# Patient Record
Sex: Male | Born: 1990 | Race: White | Hispanic: No | Marital: Single | Smoking: Never smoker
Health system: Southern US, Community
[De-identification: ages and names within clinical notes are randomized; demographics above are authoritative.]

## PROBLEM LIST (undated history)

## (undated) ENCOUNTER — Emergency Department (HOSPITAL_BASED_OUTPATIENT_CLINIC_OR_DEPARTMENT_OTHER): Admission: EM | Discharge status: Absent without leave | Payer: 59

---

## 2016-01-31 HISTORY — PX: ESOPHAGOGASTRODUODENOSCOPY ENDOSCOPY: SHX5814

## 2020-04-12 ENCOUNTER — Other Ambulatory Visit: Payer: Self-pay

## 2020-04-12 ENCOUNTER — Emergency Department (HOSPITAL_BASED_OUTPATIENT_CLINIC_OR_DEPARTMENT_OTHER): Payer: 59

## 2020-04-12 ENCOUNTER — Emergency Department (HOSPITAL_BASED_OUTPATIENT_CLINIC_OR_DEPARTMENT_OTHER)
Admission: EM | Admit: 2020-04-12 | Discharge: 2020-04-13 | Disposition: A | Discharge status: Home / Self Care | Payer: 59 | Attending: Emergency Medicine | Admitting: Emergency Medicine

## 2020-04-12 ENCOUNTER — Encounter (HOSPITAL_BASED_OUTPATIENT_CLINIC_OR_DEPARTMENT_OTHER): Payer: Self-pay | Admitting: Emergency Medicine

## 2020-04-12 DIAGNOSIS — F17200 Nicotine dependence, unspecified, uncomplicated: Secondary | ICD-10-CM | POA: Diagnosis not present

## 2020-04-12 DIAGNOSIS — R112 Nausea with vomiting, unspecified: Secondary | ICD-10-CM | POA: Diagnosis not present

## 2020-04-12 DIAGNOSIS — R1032 Left lower quadrant pain: Secondary | ICD-10-CM | POA: Insufficient documentation

## 2020-04-12 DIAGNOSIS — R197 Diarrhea, unspecified: Secondary | ICD-10-CM | POA: Diagnosis not present

## 2020-04-12 DIAGNOSIS — N135 Crossing vessel and stricture of ureter without hydronephrosis: Secondary | ICD-10-CM

## 2020-04-12 LAB — LIPASE, BLOOD: Lipase: 26 U/L (ref 11–51)

## 2020-04-12 LAB — URINALYSIS, ROUTINE W REFLEX MICROSCOPIC
Bilirubin Urine: NEGATIVE
Glucose, UA: NEGATIVE mg/dL
Hgb urine dipstick: NEGATIVE
Ketones, ur: NEGATIVE mg/dL
Leukocytes,Ua: NEGATIVE
Nitrite: NEGATIVE
Protein, ur: NEGATIVE mg/dL
Specific Gravity, Urine: 1.02 (ref 1.005–1.030)
pH: 6 (ref 5.0–8.0)

## 2020-04-12 LAB — CBC
HCT: 43.8 % (ref 39.0–52.0)
Hemoglobin: 14.9 g/dL (ref 13.0–17.0)
MCH: 30.4 pg (ref 26.0–34.0)
MCHC: 34 g/dL (ref 30.0–36.0)
MCV: 89.4 fL (ref 80.0–100.0)
Platelets: 305 10*3/uL (ref 150–400)
RBC: 4.9 MIL/uL (ref 4.22–5.81)
RDW: 12.1 % (ref 11.5–15.5)
WBC: 11 10*3/uL — ABNORMAL HIGH (ref 4.0–10.5)
nRBC: 0 % (ref 0.0–0.2)

## 2020-04-12 LAB — RAPID URINE DRUG SCREEN, HOSP PERFORMED
Amphetamines: NOT DETECTED
Barbiturates: NOT DETECTED
Benzodiazepines: NOT DETECTED
Cocaine: NOT DETECTED
Opiates: NOT DETECTED
Tetrahydrocannabinol: NOT DETECTED

## 2020-04-12 LAB — COMPREHENSIVE METABOLIC PANEL
ALT: 44 U/L (ref 0–44)
AST: 29 U/L (ref 15–41)
Albumin: 4.6 g/dL (ref 3.5–5.0)
Alkaline Phosphatase: 61 U/L (ref 38–126)
Anion gap: 11 (ref 5–15)
BUN: 12 mg/dL (ref 6–20)
CO2: 25 mmol/L (ref 22–32)
Calcium: 9.7 mg/dL (ref 8.9–10.3)
Chloride: 102 mmol/L (ref 98–111)
Creatinine, Ser: 1.05 mg/dL (ref 0.61–1.24)
GFR, Estimated: >60 mL/min (ref >60)
Glucose, Bld: 116 mg/dL — ABNORMAL HIGH (ref 70–99)
Potassium: 3.4 mmol/L — ABNORMAL LOW (ref 3.5–5.1)
Sodium: 138 mmol/L (ref 135–145)
Total Bilirubin: 0.4 mg/dL (ref 0.3–1.2)
Total Protein: 7.7 g/dL (ref 6.5–8.1)

## 2020-04-12 MED ORDER — ONDANSETRON HCL 4 MG/2ML IJ SOLN
4.0000 mg | Freq: Once | INTRAMUSCULAR | Status: AC
  Administered 2020-04-12: 4 mg via INTRAVENOUS
  Filled 2020-04-12: qty 2

## 2020-04-12 MED ORDER — FENTANYL CITRATE (PF) 100 MCG/2ML IJ SOLN
100.0000 ug | Freq: Once | INTRAMUSCULAR | Status: AC
Start: 2020-04-12 — End: 2020-04-12
  Administered 2020-04-12: 100 ug via INTRAVENOUS
  Filled 2020-04-12: qty 2

## 2020-04-12 MED ORDER — IOHEXOL 300 MG/ML  SOLN
100.0000 mL | Freq: Once | INTRAMUSCULAR | Status: AC | PRN
  Administered 2020-04-12: 100 mL via INTRAVENOUS

## 2020-04-12 MED ORDER — MORPHINE SULFATE 15 MG PO TABS: 7.5000 mg | ORAL_TABLET | ORAL | 0 refills | Status: DC | PRN

## 2020-04-12 MED ORDER — METOCLOPRAMIDE HCL 5 MG/ML IJ SOLN
10.0000 mg | INTRAMUSCULAR | Status: AC
  Administered 2020-04-12: 10 mg via INTRAVENOUS
  Filled 2020-04-12: qty 2

## 2020-04-12 MED ORDER — SODIUM CHLORIDE 0.9 % IV BOLUS
1000.0000 mL | Freq: Once | INTRAVENOUS | Status: AC
  Administered 2020-04-12: 1000 mL via INTRAVENOUS

## 2020-04-12 MED ORDER — FENTANYL CITRATE (PF) 100 MCG/2ML IJ SOLN
50.0000 ug | Freq: Once | INTRAMUSCULAR | Status: AC
  Administered 2020-04-12: 50 ug via INTRAVENOUS
  Filled 2020-04-12: qty 2

## 2020-04-12 MED ORDER — ONDANSETRON 4 MG PO TBDP: ORAL_TABLET | ORAL | 0 refills | Status: DC

## 2020-04-12 MED ORDER — KETOROLAC TROMETHAMINE 30 MG/ML IJ SOLN
30.0000 mg | Freq: Once | INTRAMUSCULAR | Status: AC
  Administered 2020-04-12: 30 mg via INTRAVENOUS
  Filled 2020-04-12: qty 1

## 2020-04-12 MED ORDER — IOHEXOL 300 MG/ML  SOLN: 100.0000 mL | Freq: Once | INTRAMUSCULAR | Status: DC | PRN

## 2020-04-12 NOTE — Discharge Instructions (Signed)

## 2020-04-12 NOTE — ED Provider Notes (Signed)
I received the patient in signout from Dr. Clarene Duke, briefly the patient is a 30 year old male with recurrent left-sided flank pain.  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  Discussed with urology who recommended outpatient follow-up as long as we could control his pain.  On reassessment the patient is feeling much better and requesting discharge home.  Urology follow-up.   Melene Plan, DO 04/12/20 2341

## 2020-04-12 NOTE — ED Provider Notes (Signed)
I received patient in transfer from med [facility] from Dr. Bernette Mayers, who had evaluated patient for abdominal pain.  CT had been ordered but unfortunately CT scanner broke down after he had completed oral contrast but had not yet been scanned.  He was sent here for completion of imaging.  Imaging shows L UPJ obstruction w/ mod to severe hydronephrosis and evidence of possible stricture. Discussed w/ urology, Dr. Annabell Howells, who recommended symptom management and if able to control pain, f/u in clinic. I have discussed this with the patient. PT signed out to overnight provider pending reassessment after pain and nausea meds.   IMPRESSION:  Left ureteropelvic junction obstruction. Moderate to severe left  hydronephrosis associated with trace delayed nephrogram and marked  dilatation of the renal pelvis. There is brisk caliber change at the  ureteropelvic junction suggestive of a possible stricture.  Intraluminal mass not excluded on this single-phase venous contrast  study. No calcified renal or ureteral stone identified.    Burma Ketcher, Ambrose Finland, MD 04/12/20 2308

## 2020-04-12 NOTE — ED Triage Notes (Signed)
LLQ abdomina pain since 5 pm.  Pt has had similar in the past without known cause.  Sees GI doctor for same.  Some N/V/D.  Pt has paperwork from MD office requesting CT scan with occurrence.

## 2020-04-12 NOTE — ED Provider Notes (Signed)
[facility] EMERGENCY DEPARTMENT Provider Note  CSN: [id number] Arrival date & time: 04/12/20 1816    History Chief Complaint  Patient presents with  . Abdominal Pain    HPI  Patrick Bullock is a 30 y.o. male with several year history of intermittent episodes of LLQ abdominal pain. Pains come on suddenly without particular provoking factors. Pain is associated with nausea and vomiting and then diarrhea the following day. He has tried multiple dietary modifications but continues to have episodes every few months. He has had EGD and CT done in the past which were unrevealing. He saw Dr. Matthias Hughs, [facility] in Oct 2021 and at that time was given a note indicating Dr. Matthias Hughs wanted him to have a CT with IV and Oral contrast during one of his spells. He reports onset of his typical pain around 5pm tonight prompting him to come to the ED. He does not drink alcohol or use any drugs. He denies any fevers. No sick contacts. He states the pain is better with standing and squatting but no real relief.    No past medical history on file.  No family history on file.  Social History   Tobacco Use  . Smoking status: Current Some Day Smoker  . Smokeless tobacco: Never Used     Home Medications Prior to Admission medications   Not on File     Allergies    Patient has no known allergies.   Review of Systems   Review of Systems A comprehensive review of systems was completed and negative except as noted in HPI.    Physical Exam Pulse (!) 52   Temp 98.4 F (36.9 C) (Oral)   Resp 16   Ht 5\' 9"  (1.753 m)   Wt 77.1 kg   SpO2 100%   BMI 25.10 kg/m   Physical Exam Vitals and nursing note reviewed.  Constitutional:      Appearance: Normal appearance.  HENT:     Head: Normocephalic and atraumatic.     Nose: Nose normal.     Mouth/Throat:     Mouth: Mucous membranes are moist.  Eyes:     Extraocular Movements: Extraocular movements intact.     Conjunctiva/sclera:  Conjunctivae normal.  Cardiovascular:     Rate and Rhythm: Normal rate.  Pulmonary:     Effort: Pulmonary effort is normal.     Breath sounds: Normal breath sounds.  Abdominal:     General: Abdomen is flat.     Palpations: Abdomen is soft.     Tenderness: There is abdominal tenderness in the left upper quadrant and left lower quadrant. There is guarding. Negative signs include Murphy's sign and McBurney's sign.  Musculoskeletal:        General: No swelling. Normal range of motion.     Cervical back: Neck supple.  Skin:    General: Skin is warm and dry.  Neurological:     General: No focal deficit present.     Mental Status: He is alert.  Psychiatric:        Mood and Affect: Mood normal.      ED Results / Procedures / Treatments   Labs (all labs ordered are listed, but only abnormal results are displayed) Labs Reviewed  COMPREHENSIVE METABOLIC PANEL - Abnormal; Notable for the following components:      Result Value   Potassium 3.4 (*)    Glucose, Bld 116 (*)    All other components within normal limits  CBC - Abnormal;  Notable for the following components:   WBC 11.0 (*)    All other components within normal limits  LIPASE, BLOOD  URINALYSIS, ROUTINE W REFLEX MICROSCOPIC  RAPID URINE DRUG SCREEN, HOSP PERFORMED    EKG None   Radiology No results found.  Procedures Procedures  Medications Ordered in the ED Medications  fentaNYL (SUBLIMAZE) injection 50 mcg (50 mcg Intravenous Given 04/12/20 1929)  ondansetron (ZOFRAN) injection 4 mg (4 mg Intravenous Given 04/12/20 1930)  sodium chloride 0.9 % bolus 1,000 mL (1,000 mLs Intravenous New Bag/Given 04/12/20 1933)     MDM Rules/Calculators/A&P MDM Patient appears uncomfortable, abdomen is tender on the left side. Will check labs and CT with oral contrast per GI recommendations. Pain and nausea meds,IVF and reassess.  ED Course  I have reviewed the triage vital signs and the nursing notes.  Pertinent labs &  imaging results that were available during my care of the patient were reviewed by me and considered in my medical decision making (see chart for details).  Clinical Course as of 04/12/20 2243  Mon Apr 12, 2020  1943 CMP and lipase.  [CS]  2002 CBC with WBC 11, otherwise normal.  [CS]  2121 UA and UDS neg. Patient continues to have some pain. Vomiting up his contrast but will try to finish the rest of the bottle. Unfortunately our CT scanner has just gone down with no estimated time for it to be repaired. I discussed this with the patient, I recommend he go to one of our other facilities to complete his CT while he has done the prep and is still having symptoms. I discussed this case with Dr. Clarene Duke at [facility] ED who will accept the patient as a transfer via [facility]. Significant other at bedside will drive. OK to leave IV in place. Zofran prior to transport.  [CS]    Clinical Course User Index [CS] Pollyann Savoy, MD    Final Clinical Impression(s) / ED Diagnoses Final diagnoses:  Left lower quadrant abdominal pain    Rx / DC Orders ED Discharge Orders    None       Pollyann Savoy, MD 04/12/20 2243

## 2020-04-12 NOTE — ED Notes (Signed)
Pt is very restless on stretcher, up moving around room, states it is difficult to be still when having this pain. Spoke with ED MD in regards to pt complaints

## 2020-04-12 NOTE — ED Notes (Signed)
Pt educated on discharge paper work. PIV removed. Vitals taken. Ambulatory with steady gait and with family member.

## 2020-04-12 NOTE — ED Notes (Signed)
Pt IS GOING TO [facility] ( [facility]), IV WAS REMOVED . PT IS GOING FOR A CT

## 2020-04-19 ENCOUNTER — Other Ambulatory Visit: Payer: Self-pay | Admitting: Urology

## 2020-04-19 DIAGNOSIS — N130 Hydronephrosis with ureteropelvic junction obstruction: Secondary | ICD-10-CM

## 2020-04-29 ENCOUNTER — Encounter (HOSPITAL_COMMUNITY)
Admission: RE | Admit: 2020-04-29 | Discharge: 2020-04-29 | Disposition: A | Discharge status: Home / Self Care | Payer: 59 | Source: Ambulatory Visit | Attending: Urology | Admitting: Urology

## 2020-04-29 ENCOUNTER — Other Ambulatory Visit: Payer: Self-pay

## 2020-04-29 DIAGNOSIS — N130 Hydronephrosis with ureteropelvic junction obstruction: Secondary | ICD-10-CM | POA: Insufficient documentation

## 2020-04-29 MED ORDER — FUROSEMIDE 10 MG/ML IJ SOLN
38.0000 mg | Freq: Once | INTRAMUSCULAR | Status: AC
  Administered 2020-04-29: 38 mg via INTRAVENOUS

## 2020-04-29 MED ORDER — FUROSEMIDE 10 MG/ML IJ SOLN
INTRAMUSCULAR | Status: AC
  Filled 2020-04-29: qty 4

## 2020-04-29 MED ORDER — TECHNETIUM TC 99M MERTIATIDE
4.7000 | Freq: Once | INTRAVENOUS | Status: AC | PRN
  Administered 2020-04-29: 4.7 via INTRAVENOUS

## 2020-05-04 ENCOUNTER — Other Ambulatory Visit: Payer: Self-pay | Admitting: Urology

## 2020-07-12 ENCOUNTER — Other Ambulatory Visit (HOSPITAL_COMMUNITY): Payer: Self-pay

## 2020-07-15 NOTE — Patient Instructions (Addendum)
DUE TO COVID-19 ONLY ONE VISITOR IS ALLOWED TO COME WITH YOU AND STAY IN THE WAITING ROOM ONLY DURING PRE OP AND PROCEDURE.    **NO VISITORS ARE ALLOWED IN THE SHORT STAY AREA OR RECOVERY ROOM!!**   IF YOU WILL BE ADMITTED INTO THE HOSPITAL YOU ARE ALLOWED ONLY TWO SUPPORT PEOPLE DURING VISITATION HOURS ONLY (10AM -8PM)   The support person(s) may change daily. The support person(s) must pass our screening, gel in and out, and wear a mask at all times, including in the patient's room. Patients must also wear a mask when staff or their support person are in the room.  No visitors under the age of 29. Any visitor under the age of 29 must be accompanied by an adult.    COVID SWAB TESTING MUST BE COMPLETED ON:  07/19/20 at 9:55    [address] [place], [place] [id number]   You are not required to quarantine, however you are required to wear a well-fitted mask when you are out and around people not in your household.  Hand Hygiene often Do NOT share personal items Notify your provider if you are in close contact with someone who has COVID or you develop fever 100.4 or greater, new onset of sneezing, cough, sore throat, shortness of breath or body aches.  [facility] Entrance [address], [address], must go inside of the hospital, NOT A DRIVE THRU!  (Must self quarantine after testing. Follow instructions on handout.)        Your procedure is scheduled on: 07/21/20   [facility]  Report to admitting at  10:15 AM    Call this number if you have problems the morning of surgery [phone]   Do not eat food :After Midnight.   May have liquids until   9:15 am day of surgery  CLEAR LIQUID DIET  Foods Allowed                                                                     Foods Excluded  Water, Black Coffee and tea, regular and decaf                             liquids that you cannot  Plain Jell-O in any  flavor  (No red)                                           see through such as: Fruit ices (not with fruit pulp)                                     milk, soups, orange juice              Iced Popsicles (No red)                                    All solid  food                                   Apple juices Sports drinks like Gatorade (No red) Lightly seasoned clear broth or consume(fat free) Sugar, honey syrup         Oral Hygiene is also important to reduce your risk of infection.                                    Remember - BRUSH YOUR TEETH THE MORNING OF SURGERY WITH YOUR REGULAR TOOTHPASTE   Do NOT smoke after Midnight    Take these medicines the morning of surgery with A SIP OF WATER: None                                You may not have any metal on your body including hair pins, jewelry, and body piercing             Do not wear make-up, lotions, powders, perfumes/cologne, or deodorant              Men may shave face and neck.   Do not bring valuables to the hospital. [facility] IS NOT             RESPONSIBLE   FOR VALUABLES.   Contacts, dentures or bridgework may not be worn into surgery.   Bring small overnight bag day of surgery.      Special Instructions: Bring a copy of your healthcare power of attorney and living will documents the day of surgery if you haven't scanned them in before.              Please read over the following fact sheets you were given:  IF YOU HAVE QUESTIONS ABOUT YOUR PRE OP INSTRUCTIONS PLEASE CALL [phone]     [facility] - Preparing for Surgery Before surgery, you can play an important role.  Because skin is not sterile, your skin needs to be as free of germs as possible.  You can reduce the number of germs on your skin by washing with CHG (chlorahexidine gluconate) soap before surgery.  CHG is an antiseptic cleaner which kills germs and bonds with the skin to continue killing germs even after washing. Please DO NOT use if you  have an allergy to CHG or antibacterial soaps.  If your skin becomes reddened/irritated stop using the CHG and inform your nurse when you arrive at Short Stay.  You may shave your face/neck.  Please follow these instructions carefully:  1.  Shower with CHG Soap the night before surgery and the  morning of Surgery.  2.  If you choose to wash your hair, wash your hair first as usual with your  normal  shampoo.  3.  After you shampoo, rinse your hair and body thoroughly to remove the  shampoo.                                        4.  Use CHG as you would any other liquid soap.  You can apply chg directly  to the skin and wash  Gently with a scrungie or clean washcloth.  5.  Apply the CHG Soap to your body ONLY FROM THE NECK DOWN.   Do not use on face/ open                           Wound or open sores. Avoid contact with eyes, ears mouth and genitals (private parts).                       Wash face,  Genitals (private parts) with your normal soap.             6.  Wash thoroughly, paying special attention to the area where your surgery  will be performed.  7.  Thoroughly rinse your body with warm water from the neck down.  8.  DO NOT shower/wash with your normal soap after using and rinsing off  the CHG Soap.            9.  Pat yourself dry with a clean towel.            10.  Wear clean pajamas.            11.  Place clean sheets on your bed the night of your first shower and do not  sleep with pets. Day of Surgery : Do not apply any lotions/deodorants the morning of surgery.  Please wear clean clothes to the hospital/surgery center.  FAILURE TO FOLLOW THESE INSTRUCTIONS MAY RESULT IN THE CANCELLATION OF YOUR SURGERY PATIENT SIGNATURE_________________________________  NURSE SIGNATURE__________________________________  ________________________________________________________________________   Patrick Bullock  An incentive spirometer is a tool that can help keep  your lungs clear and active. This tool measures how well you are filling your lungs with each breath. Taking long deep breaths may help reverse or decrease the chance of developing breathing (pulmonary) problems (especially infection) following: A long period of time when you are unable to move or be active. BEFORE THE PROCEDURE  If the spirometer includes an indicator to show your best effort, your nurse or respiratory therapist will set it to a desired goal. If possible, sit up straight or lean slightly forward. Try not to slouch. Hold the incentive spirometer in an upright position. INSTRUCTIONS FOR USE  Sit on the edge of your bed if possible, or sit up as far as you can in bed or on a chair. Hold the incentive spirometer in an upright position. Breathe out normally. Place the mouthpiece in your mouth and seal your lips tightly around it. Breathe in slowly and as deeply as possible, raising the piston or the ball toward the top of the column. Hold your breath for 3-5 seconds or for as long as possible. Allow the piston or ball to fall to the bottom of the column. Remove the mouthpiece from your mouth and breathe out normally. Rest for a few seconds and repeat Steps 1 through 7 at least 10 times every 1-2 hours when you are awake. Take your time and take a few normal breaths between deep breaths. The spirometer may include an indicator to show your best effort. Use the indicator as a goal to work toward during each repetition. After each set of 10 deep breaths, practice coughing to be sure your lungs are clear. If you have an incision (the cut made at the time of surgery), support your incision when coughing by placing a pillow or rolled up towels firmly against it. Once you  are able to get out of bed, walk around indoors and cough well. You may stop using the incentive spirometer when instructed by your caregiver.  RISKS AND COMPLICATIONS Take your time so you do not get dizzy or  light-headed. If you are in pain, you may need to take or ask for pain medication before doing incentive spirometry. It is harder to take a deep breath if you are having pain. AFTER USE Rest and breathe slowly and easily. It can be helpful to keep track of a log of your progress. Your caregiver can provide you with a simple table to help with this. If you are using the spirometer at home, follow these instructions: SEEK MEDICAL CARE IF:  You are having difficultly using the spirometer. You have trouble using the spirometer as often as instructed. Your pain medication is not giving enough relief while using the spirometer. You develop fever of 100.5 F (38.1 C) or higher. SEEK IMMEDIATE MEDICAL CARE IF:  You cough up bloody sputum that had not been present before. You develop fever of 102 F (38.9 C) or greater. You develop worsening pain at or near the incision site. MAKE SURE YOU:  Understand these instructions. Will watch your condition. Will get help right away if you are not doing well or get worse. Document Released: 05/29/2006 Document Revised: 04/10/2011 Document Reviewed: 07/30/2006 Exit[employer] Patient Information 2014 [employer], [place].   ________________________________________________________________________  WHAT IS A BLOOD TRANSFUSION? Blood Transfusion Information  A transfusion is the replacement of blood or some of its parts. Blood is made up of multiple cells which provide different functions. Red blood cells carry oxygen and are used for blood loss replacement. White blood cells fight against infection. Platelets control bleeding. Plasma helps clot blood. Other blood products are available for specialized needs, such as hemophilia or other clotting disorders. BEFORE THE TRANSFUSION  Who gives blood for transfusions?  Healthy volunteers who are fully evaluated to make sure their blood is safe. This is blood bank blood. Transfusion therapy is the safest it has ever  been in the practice of medicine. Before blood is taken from a donor, a complete history is taken to make sure that person has no history of diseases nor engages in risky social behavior (examples are intravenous drug use or sexual activity with multiple partners). The donor's travel history is screened to minimize risk of transmitting infections, such as malaria. The donated blood is tested for signs of infectious diseases, such as HIV and hepatitis. The blood is then tested to be sure it is compatible with you in order to minimize the chance of a transfusion reaction. If you or a relative donates blood, this is often done in anticipation of surgery and is not appropriate for emergency situations. It takes many days to process the donated blood. RISKS AND COMPLICATIONS Although transfusion therapy is very safe and saves many lives, the main dangers of transfusion include:  Getting an infectious disease. Developing a transfusion reaction. This is an allergic reaction to something in the blood you were given. Every precaution is taken to prevent this. The decision to have a blood transfusion has been considered carefully by your caregiver before blood is given. Blood is not given unless the benefits outweigh the risks. AFTER THE TRANSFUSION Right after receiving a blood transfusion, you will usually feel much better and more energetic. This is especially true if your red blood cells have gotten low (anemic). The transfusion raises the level of the red blood cells which carry  oxygen, and this usually causes an energy increase. The nurse administering the transfusion will monitor you carefully for complications. HOME CARE INSTRUCTIONS  No special instructions are needed after a transfusion. You may find your energy is better. Speak with your caregiver about any limitations on activity for underlying diseases you may have. SEEK MEDICAL CARE IF:  Your condition is not improving after your transfusion. You  develop redness or irritation at the intravenous (IV) site. SEEK IMMEDIATE MEDICAL CARE IF:  Any of the following symptoms occur over the next 12 hours: Shaking chills. You have a temperature by mouth above 102 F (38.9 C), not controlled by medicine. Chest, back, or muscle pain. People around you feel you are not acting correctly or are confused. Shortness of breath or difficulty breathing. Dizziness and fainting. You get a rash or develop hives. You have a decrease in urine output. Your urine turns a dark color or changes to pink, red, or brown. Any of the following symptoms occur over the next 10 days: You have a temperature by mouth above 102 F (38.9 C), not controlled by medicine. Shortness of breath. Weakness after normal activity. The white part of the eye turns yellow (jaundice). You have a decrease in the amount of urine or are urinating less often. Your urine turns a dark color or changes to pink, red, or brown. Document Released: 01/14/2000 Document Revised: 04/10/2011 Document Reviewed: 09/02/2007 [facility] Patient Information 2014 [place], [place].  _______________________________________________________________________

## 2020-07-16 ENCOUNTER — Other Ambulatory Visit: Payer: Self-pay

## 2020-07-16 ENCOUNTER — Encounter (HOSPITAL_COMMUNITY): Payer: Self-pay

## 2020-07-16 ENCOUNTER — Encounter (HOSPITAL_COMMUNITY)
Admission: RE | Admit: 2020-07-16 | Discharge: 2020-07-16 | Disposition: A | Discharge status: Home / Self Care | Payer: 59 | Source: Ambulatory Visit | Attending: Urology | Admitting: Urology

## 2020-07-16 DIAGNOSIS — Z01812 Encounter for preprocedural laboratory examination: Secondary | ICD-10-CM | POA: Diagnosis not present

## 2020-07-16 LAB — BASIC METABOLIC PANEL
Anion gap: 6 (ref 5–15)
BUN: 18 mg/dL (ref 6–20)
CO2: 26 mmol/L (ref 22–32)
Calcium: 9.7 mg/dL (ref 8.9–10.3)
Chloride: 106 mmol/L (ref 98–111)
Creatinine, Ser: 1.04 mg/dL (ref 0.61–1.24)
GFR, Estimated: >60 mL/min (ref >60)
Glucose, Bld: 92 mg/dL (ref 70–99)
Potassium: 4.5 mmol/L (ref 3.5–5.1)
Sodium: 138 mmol/L (ref 135–145)

## 2020-07-16 LAB — CBC
HCT: 45.3 % (ref 39.0–52.0)
Hemoglobin: 15.2 g/dL (ref 13.0–17.0)
MCH: 30.5 pg (ref 26.0–34.0)
MCHC: 33.6 g/dL (ref 30.0–36.0)
MCV: 90.8 fL (ref 80.0–100.0)
Platelets: 285 10*3/uL (ref 150–400)
RBC: 4.99 MIL/uL (ref 4.22–5.81)
RDW: 11.9 % (ref 11.5–15.5)
WBC: 4.4 10*3/uL (ref 4.0–10.5)
nRBC: 0 % (ref 0.0–0.2)

## 2020-07-16 NOTE — Progress Notes (Signed)
COVID Vaccine Completed:Yes  Date COVID Vaccine completed:03/17/19 COVID vaccine manufacturer: Pfizer     PCP - Dr. Jarold Motto in [place] Cardiologist - no  Chest x-ray - no EKG - no Stress Test - no ECHO - no Cardiac Cath - no Pacemaker/ICD device last checked:NA  Sleep Study - no CPAP -   Fasting Blood Sugar - NA Checks Blood Sugar _____ times a day  Blood Thinner Instructions:NA Aspirin Instructions: Last Dose:  Anesthesia review:   Patient denies shortness of breath, fever, cough and chest pain at PAT appointment Yes. 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 No SOB with any activities  Patient verbalized understanding of instructions that were given to them at the PAT appointment. Patient was also instructed that they will need to review over the PAT instructions again at home before surgery. yes

## 2020-07-19 ENCOUNTER — Other Ambulatory Visit (HOSPITAL_COMMUNITY)
Admission: RE | Admit: 2020-07-19 | Discharge: 2020-07-19 | Disposition: A | Discharge status: Home / Self Care | Payer: 59 | Source: Ambulatory Visit | Attending: Urology | Admitting: Urology

## 2020-07-19 DIAGNOSIS — Z20822 Contact with and (suspected) exposure to covid-19: Secondary | ICD-10-CM | POA: Insufficient documentation

## 2020-07-19 DIAGNOSIS — Z01812 Encounter for preprocedural laboratory examination: Secondary | ICD-10-CM | POA: Diagnosis present

## 2020-07-19 LAB — SARS CORONAVIRUS 2 (TAT 6-24 HRS): SARS Coronavirus 2: NEGATIVE

## 2020-07-21 ENCOUNTER — Ambulatory Visit (HOSPITAL_COMMUNITY): Payer: PRIVATE HEALTH INSURANCE

## 2020-07-21 ENCOUNTER — Encounter (HOSPITAL_COMMUNITY)
Admission: RE | Disposition: A | Discharge status: Home / Self Care | Payer: Self-pay | Source: Home / Self Care | Attending: Urology

## 2020-07-21 ENCOUNTER — Encounter (HOSPITAL_COMMUNITY): Payer: Self-pay | Admitting: Urology

## 2020-07-21 ENCOUNTER — Ambulatory Visit (HOSPITAL_COMMUNITY): Payer: PRIVATE HEALTH INSURANCE | Admitting: Certified Registered Nurse Anesthetist

## 2020-07-21 ENCOUNTER — Observation Stay (HOSPITAL_COMMUNITY)
Admission: RE | Admit: 2020-07-21 | Discharge: 2020-07-22 | Disposition: A | Discharge status: Home / Self Care | Payer: PRIVATE HEALTH INSURANCE | Attending: Urology | Admitting: Urology

## 2020-07-21 ENCOUNTER — Other Ambulatory Visit (HOSPITAL_COMMUNITY): Payer: Self-pay

## 2020-07-21 DIAGNOSIS — N135 Crossing vessel and stricture of ureter without hydronephrosis: Principal | ICD-10-CM | POA: Insufficient documentation

## 2020-07-21 DIAGNOSIS — N35811 Other urethral stricture, male, meatal: Secondary | ICD-10-CM | POA: Insufficient documentation

## 2020-07-21 HISTORY — PX: CYSTOSCOPY W/ RETROGRADES: SHX1426

## 2020-07-21 HISTORY — PX: ROBOT ASSISTED PYELOPLASTY: SHX5143

## 2020-07-21 LAB — HEMOGLOBIN AND HEMATOCRIT, BLOOD
HCT: 41.4 % (ref 39.0–52.0)
Hemoglobin: 13.8 g/dL (ref 13.0–17.0)

## 2020-07-21 LAB — ABO/RH: ABO/RH(D): A POS

## 2020-07-21 LAB — TYPE AND SCREEN
ABO/RH(D): A POS
Antibody Screen: NEGATIVE

## 2020-07-21 SURGERY — PYELOPLASTY, ROBOT-ASSISTED
Anesthesia: General | Laterality: Left

## 2020-07-21 MED ORDER — DEXAMETHASONE SODIUM PHOSPHATE 10 MG/ML IJ SOLN
INTRAMUSCULAR | Status: AC
  Filled 2020-07-21: qty 1

## 2020-07-21 MED ORDER — HYDROMORPHONE HCL 1 MG/ML IJ SOLN: 0.2500 mg | INTRAMUSCULAR | Status: DC | PRN

## 2020-07-21 MED ORDER — ROCURONIUM BROMIDE 10 MG/ML (PF) SYRINGE
PREFILLED_SYRINGE | INTRAVENOUS | Status: DC | PRN
  Administered 2020-07-21: 80 mg via INTRAVENOUS
  Administered 2020-07-21: 20 mg via INTRAVENOUS

## 2020-07-21 MED ORDER — CHLORHEXIDINE GLUCONATE 0.12 % MT SOLN
15.0000 mL | Freq: Once | OROMUCOSAL | Status: AC
  Administered 2020-07-21: 15 mL via OROMUCOSAL

## 2020-07-21 MED ORDER — KETAMINE HCL 10 MG/ML IJ SOLN
INTRAMUSCULAR | Status: DC | PRN
  Administered 2020-07-21: 30 mg via INTRAVENOUS
  Administered 2020-07-21: 20 mg via INTRAVENOUS

## 2020-07-21 MED ORDER — KETOROLAC TROMETHAMINE 30 MG/ML IJ SOLN
INTRAMUSCULAR | Status: DC | PRN
  Administered 2020-07-21: 30 mg via INTRAVENOUS

## 2020-07-21 MED ORDER — KETOROLAC TROMETHAMINE 15 MG/ML IJ SOLN
15.0000 mg | Freq: Four times a day (QID) | INTRAMUSCULAR | Status: DC
Start: 2020-07-22 — End: 2020-07-22
  Administered 2020-07-22 (×2): 15 mg via INTRAVENOUS
  Filled 2020-07-21 (×3): qty 1

## 2020-07-21 MED ORDER — MIDAZOLAM HCL 5 MG/5ML IJ SOLN
INTRAMUSCULAR | Status: DC | PRN
  Administered 2020-07-21: 2 mg via INTRAVENOUS

## 2020-07-21 MED ORDER — LACTATED RINGERS IR SOLN
Status: DC | PRN
  Administered 2020-07-21: 1000 mL

## 2020-07-21 MED ORDER — DIPHENHYDRAMINE HCL 50 MG/ML IJ SOLN: 12.5000 mg | Freq: Four times a day (QID) | INTRAMUSCULAR | Status: DC | PRN

## 2020-07-21 MED ORDER — DOCUSATE SODIUM 100 MG PO CAPS: 100.0000 mg | ORAL_CAPSULE | Freq: Two times a day (BID) | ORAL | Status: AC

## 2020-07-21 MED ORDER — OXYCODONE HCL 5 MG PO TABS
ORAL_TABLET | ORAL | Status: AC
  Filled 2020-07-21: qty 1

## 2020-07-21 MED ORDER — SODIUM CHLORIDE 0.9 % IR SOLN
Status: DC | PRN
  Administered 2020-07-21: 3000 mL

## 2020-07-21 MED ORDER — BACITRACIN-NEOMYCIN-POLYMYXIN 400-5-5000 EX OINT: 1.0000 "application " | TOPICAL_OINTMENT | Freq: Three times a day (TID) | CUTANEOUS | Status: DC | PRN

## 2020-07-21 MED ORDER — OXYCODONE HCL 5 MG PO TABS
5.0000 mg | ORAL_TABLET | Freq: Once | ORAL | Status: AC | PRN
  Administered 2020-07-21: 5 mg via ORAL

## 2020-07-21 MED ORDER — WATER FOR IRRIGATION, STERILE IR SOLN
Status: DC | PRN
  Administered 2020-07-21: 1000 mL

## 2020-07-21 MED ORDER — BELLADONNA ALKALOIDS-OPIUM 16.2-60 MG RE SUPP: 1.0000 | Freq: Four times a day (QID) | RECTAL | Status: DC | PRN

## 2020-07-21 MED ORDER — FENTANYL CITRATE (PF) 100 MCG/2ML IJ SOLN
INTRAMUSCULAR | Status: DC | PRN
  Administered 2020-07-21 (×2): 50 ug via INTRAVENOUS
  Administered 2020-07-21: 100 ug via INTRAVENOUS

## 2020-07-21 MED ORDER — PROMETHAZINE HCL 25 MG/ML IJ SOLN
6.2500 mg | INTRAMUSCULAR | Status: DC | PRN
Start: 2020-07-21 — End: 2020-07-21
  Administered 2020-07-21: 6.25 mg via INTRAVENOUS

## 2020-07-21 MED ORDER — LIDOCAINE 2% (20 MG/ML) 5 ML SYRINGE
INTRAMUSCULAR | Status: DC | PRN
  Administered 2020-07-21: 80 mg via INTRAVENOUS

## 2020-07-21 MED ORDER — ORAL CARE MOUTH RINSE: 15.0000 mL | Freq: Once | OROMUCOSAL | Status: AC

## 2020-07-21 MED ORDER — MIDAZOLAM HCL 2 MG/2ML IJ SOLN
INTRAMUSCULAR | Status: AC
  Filled 2020-07-21: qty 2

## 2020-07-21 MED ORDER — SODIUM CHLORIDE (PF) 0.9 % IJ SOLN
INTRAMUSCULAR | Status: AC
  Filled 2020-07-21: qty 20

## 2020-07-21 MED ORDER — KETOROLAC TROMETHAMINE 30 MG/ML IJ SOLN
INTRAMUSCULAR | Status: AC
  Filled 2020-07-21: qty 1

## 2020-07-21 MED ORDER — PHENYLEPHRINE 40 MCG/ML (10ML) SYRINGE FOR IV PUSH (FOR BLOOD PRESSURE SUPPORT)
PREFILLED_SYRINGE | INTRAVENOUS | Status: AC
  Filled 2020-07-21: qty 10

## 2020-07-21 MED ORDER — CEFAZOLIN SODIUM-DEXTROSE 1-4 GM/50ML-% IV SOLN
1.0000 g | Freq: Three times a day (TID) | INTRAVENOUS | Status: AC
  Administered 2020-07-21 – 2020-07-22 (×2): 1 g via INTRAVENOUS
  Filled 2020-07-21 (×2): qty 50

## 2020-07-21 MED ORDER — PROMETHAZINE HCL 12.5 MG PO TABS
12.5000 mg | ORAL_TABLET | ORAL | 0 refills | Status: AC | PRN
  Filled 2020-07-21: qty 15, 3d supply, fill #0

## 2020-07-21 MED ORDER — FENTANYL CITRATE (PF) 100 MCG/2ML IJ SOLN
INTRAMUSCULAR | Status: AC
  Filled 2020-07-21: qty 2

## 2020-07-21 MED ORDER — DEXAMETHASONE SODIUM PHOSPHATE 10 MG/ML IJ SOLN
INTRAMUSCULAR | Status: DC | PRN
  Administered 2020-07-21: 10 mg via INTRAVENOUS

## 2020-07-21 MED ORDER — CEFAZOLIN SODIUM-DEXTROSE 2-4 GM/100ML-% IV SOLN
2.0000 g | Freq: Once | INTRAVENOUS | Status: DC
  Filled 2020-07-21: qty 100

## 2020-07-21 MED ORDER — DIPHENHYDRAMINE HCL 12.5 MG/5ML PO ELIX: 12.5000 mg | ORAL_SOLUTION | Freq: Four times a day (QID) | ORAL | Status: DC | PRN

## 2020-07-21 MED ORDER — PROPOFOL 10 MG/ML IV BOLUS
INTRAVENOUS | Status: DC | PRN
  Administered 2020-07-21: 170 mg via INTRAVENOUS

## 2020-07-21 MED ORDER — ONDANSETRON HCL 4 MG/2ML IJ SOLN
INTRAMUSCULAR | Status: DC | PRN
  Administered 2020-07-21: 4 mg via INTRAVENOUS

## 2020-07-21 MED ORDER — CEFAZOLIN SODIUM-DEXTROSE 2-3 GM-%(50ML) IV SOLR
INTRAVENOUS | Status: DC | PRN
  Administered 2020-07-21: 2 g via INTRAVENOUS

## 2020-07-21 MED ORDER — DOCUSATE SODIUM 100 MG PO CAPS
100.0000 mg | ORAL_CAPSULE | Freq: Two times a day (BID) | ORAL | Status: DC
  Administered 2020-07-21 – 2020-07-22 (×2): 100 mg via ORAL
  Filled 2020-07-21 (×2): qty 1

## 2020-07-21 MED ORDER — OXYCODONE HCL 5 MG/5ML PO SOLN: 5.0000 mg | Freq: Once | ORAL | Status: AC | PRN

## 2020-07-21 MED ORDER — KETAMINE HCL 10 MG/ML IJ SOLN
INTRAMUSCULAR | Status: AC
  Filled 2020-07-21: qty 1

## 2020-07-21 MED ORDER — DEXTROSE-NACL 5-0.45 % IV SOLN: INTRAVENOUS | Status: DC

## 2020-07-21 MED ORDER — ONDANSETRON HCL 4 MG/2ML IJ SOLN: 4.0000 mg | INTRAMUSCULAR | Status: DC | PRN

## 2020-07-21 MED ORDER — HYDROCODONE-ACETAMINOPHEN 5-325 MG PO TABS
1.0000 | ORAL_TABLET | Freq: Four times a day (QID) | ORAL | 0 refills | Status: AC | PRN
  Filled 2020-07-21: qty 20, 3d supply, fill #0

## 2020-07-21 MED ORDER — IOHEXOL 300 MG/ML  SOLN
INTRAMUSCULAR | Status: DC | PRN
  Administered 2020-07-21: 30 mL

## 2020-07-21 MED ORDER — SODIUM CHLORIDE (PF) 0.9 % IJ SOLN
INTRAMUSCULAR | Status: DC | PRN
  Administered 2020-07-21: 20 mL

## 2020-07-21 MED ORDER — LIDOCAINE HCL 2 % IJ SOLN
INTRAMUSCULAR | Status: AC
  Filled 2020-07-21: qty 20

## 2020-07-21 MED ORDER — SUGAMMADEX SODIUM 200 MG/2ML IV SOLN
INTRAVENOUS | Status: DC | PRN
  Administered 2020-07-21: 200 mg via INTRAVENOUS

## 2020-07-21 MED ORDER — ACETAMINOPHEN 325 MG PO TABS: 650.0000 mg | ORAL_TABLET | ORAL | Status: DC | PRN

## 2020-07-21 MED ORDER — ROCURONIUM BROMIDE 10 MG/ML (PF) SYRINGE
PREFILLED_SYRINGE | INTRAVENOUS | Status: AC
  Filled 2020-07-21: qty 10

## 2020-07-21 MED ORDER — SULFAMETHOXAZOLE-TRIMETHOPRIM 800-160 MG PO TABS
1.0000 | ORAL_TABLET | Freq: Two times a day (BID) | ORAL | 0 refills | Status: DC
  Filled 2020-07-21: qty 6, 3d supply, fill #0

## 2020-07-21 MED ORDER — OXYCODONE HCL 5 MG PO TABS: 5.0000 mg | ORAL_TABLET | ORAL | Status: DC | PRN

## 2020-07-21 MED ORDER — LIDOCAINE 2% (20 MG/ML) 5 ML SYRINGE
INTRAMUSCULAR | Status: AC
  Filled 2020-07-21: qty 5

## 2020-07-21 MED ORDER — BUPIVACAINE LIPOSOME 1.3 % IJ SUSP
20.0000 mL | Freq: Once | INTRAMUSCULAR | Status: AC
  Administered 2020-07-21: 20 mL
  Filled 2020-07-21: qty 20

## 2020-07-21 MED ORDER — PROMETHAZINE HCL 25 MG/ML IJ SOLN
INTRAMUSCULAR | Status: AC
  Filled 2020-07-21: qty 1

## 2020-07-21 MED ORDER — HYDROMORPHONE HCL 1 MG/ML IJ SOLN
0.5000 mg | INTRAMUSCULAR | Status: DC | PRN
  Administered 2020-07-21: 1 mg via INTRAVENOUS
  Filled 2020-07-21: qty 1

## 2020-07-21 MED ORDER — LACTATED RINGERS IV SOLN: INTRAVENOUS | Status: DC

## 2020-07-21 MED ORDER — ONDANSETRON HCL 4 MG/2ML IJ SOLN
INTRAMUSCULAR | Status: AC
  Filled 2020-07-21: qty 2

## 2020-07-21 MED ORDER — PHENYLEPHRINE 40 MCG/ML (10ML) SYRINGE FOR IV PUSH (FOR BLOOD PRESSURE SUPPORT)
PREFILLED_SYRINGE | INTRAVENOUS | Status: DC | PRN
  Administered 2020-07-21 (×2): 80 ug via INTRAVENOUS

## 2020-07-21 MED ORDER — AMISULPRIDE (ANTIEMETIC) 5 MG/2ML IV SOLN: 10.0000 mg | Freq: Once | INTRAVENOUS | Status: DC | PRN

## 2020-07-21 SURGICAL SUPPLY — 67 items
BAG URO CATCHER STRL LF (MISCELLANEOUS) ×2 IMPLANT
CATH FOLEY 2WAY SLVR  5CC 16FR (CATHETERS) ×1
CATH FOLEY 2WAY SLVR 5CC 16FR (CATHETERS) ×1 IMPLANT
CATH URET 5FR 28IN OPEN ENDED (CATHETERS) ×2 IMPLANT
CATH URET FLEX-TIP 2 LUMEN 10F (CATHETERS) ×2 IMPLANT
CHLORAPREP W/TINT 26 (MISCELLANEOUS) ×2 IMPLANT
CLIP VESOLOCK LG 6/CT PURPLE (CLIP) ×2 IMPLANT
CLIP VESOLOCK MED LG 6/CT (CLIP) ×2 IMPLANT
COVER SURGICAL LIGHT HANDLE (MISCELLANEOUS) ×2 IMPLANT
COVER TIP SHEARS 8 DVNC (MISCELLANEOUS) ×1 IMPLANT
COVER TIP SHEARS 8MM DA VINCI (MISCELLANEOUS) ×1
COVER WAND RF STERILE (DRAPES) IMPLANT
DECANTER SPIKE VIAL GLASS SM (MISCELLANEOUS) ×2 IMPLANT
DERMABOND ADVANCED (GAUZE/BANDAGES/DRESSINGS) ×1
DERMABOND ADVANCED .7 DNX12 (GAUZE/BANDAGES/DRESSINGS) ×1 IMPLANT
DRAIN CHANNEL 15F RND FF 3/16 (WOUND CARE) IMPLANT
DRAPE ARM DVNC X/XI (DISPOSABLE) ×4 IMPLANT
DRAPE COLUMN DVNC XI (DISPOSABLE) ×1 IMPLANT
DRAPE DA VINCI XI ARM (DISPOSABLE) ×4
DRAPE DA VINCI XI COLUMN (DISPOSABLE) ×1
DRAPE INCISE IOBAN 66X45 STRL (DRAPES) ×4 IMPLANT
DRAPE LAPAROSCOPIC ABDOMINAL (DRAPES) ×2 IMPLANT
DRAPE SHEET LG 3/4 BI-LAMINATE (DRAPES) ×6 IMPLANT
DRSG TEGADERM 4X4.75 (GAUZE/BANDAGES/DRESSINGS) IMPLANT
ELECT PENCIL ROCKER SW 15FT (MISCELLANEOUS) ×2 IMPLANT
ELECT REM PT RETURN 15FT ADLT (MISCELLANEOUS) ×2 IMPLANT
EVACUATOR SILICONE 100CC (DRAIN) IMPLANT
GAUZE SPONGE 2X2 8PLY STRL LF (GAUZE/BANDAGES/DRESSINGS) ×1 IMPLANT
GLOVE SURG ENC MOIS LTX SZ6.5 (GLOVE) ×2 IMPLANT
GLOVE SURG ENC TEXT LTX SZ7.5 (GLOVE) ×4 IMPLANT
GOWN STRL REUS W/TWL LRG LVL3 (GOWN DISPOSABLE) ×2 IMPLANT
GOWN STRL REUS W/TWL XL LVL3 (GOWN DISPOSABLE) ×4 IMPLANT
GUIDEWIRE ANG ZIPWIRE 038X150 (WIRE) ×2 IMPLANT
GUIDEWIRE STR DUAL SENSOR (WIRE) ×2 IMPLANT
GUIDEWIRE ZIPWRE .038 STRAIGHT (WIRE) IMPLANT
IRRIG SUCT STRYKERFLOW 2 WTIP (MISCELLANEOUS) ×2
IRRIGATION SUCT STRKRFLW 2 WTP (MISCELLANEOUS) ×1 IMPLANT
KIT BASIN OR (CUSTOM PROCEDURE TRAY) ×2 IMPLANT
KIT TURNOVER KIT A (KITS) ×2 IMPLANT
LOOP VESSEL MAXI BLUE (MISCELLANEOUS) IMPLANT
NEEDLE INSUFFLATION 14GA 120MM (NEEDLE) ×2 IMPLANT
PACK CYSTO (CUSTOM PROCEDURE TRAY) IMPLANT
PROTECTOR NERVE ULNAR (MISCELLANEOUS) ×4 IMPLANT
SCISSORS LAP 5X35 DISP (ENDOMECHANICALS) ×2 IMPLANT
SEAL CANN UNIV 5-8 DVNC XI (MISCELLANEOUS) ×3 IMPLANT
SEAL XI 5MM-8MM UNIVERSAL (MISCELLANEOUS) ×3
SET IRRIG Y TYPE TUR BLADDER L (SET/KITS/TRAYS/PACK) ×2 IMPLANT
SOLUTION ELECTROLUBE (MISCELLANEOUS) ×2 IMPLANT
SPONGE GAUZE 2X2 STER 10/PKG (GAUZE/BANDAGES/DRESSINGS) ×1
STENT URET 6FRX24 CONTOUR (STENTS) ×2 IMPLANT
SUT ETHILON 3 0 PS 1 (SUTURE) IMPLANT
SUT MNCRL AB 4-0 PS2 18 (SUTURE) ×4 IMPLANT
SUT VIC AB 0 CT1 27 (SUTURE)
SUT VIC AB 0 CT1 27XBRD ANTBC (SUTURE) IMPLANT
SUT VIC AB 0 UR5 27 (SUTURE) IMPLANT
SUT VIC AB 4-0 RB1 27 (SUTURE) ×4
SUT VIC AB 4-0 RB1 27XBRD (SUTURE) ×4 IMPLANT
SUT VICRYL 0 UR6 27IN ABS (SUTURE) ×2 IMPLANT
SUT VLOC BARB 180 ABS3/0GR12 (SUTURE)
SUTURE VLOC BRB 180 ABS3/0GR12 (SUTURE) IMPLANT
SYR BULB IRRIG 60ML STRL (SYRINGE) IMPLANT
TOWEL OR 17X26 10 PK STRL BLUE (TOWEL DISPOSABLE) ×2 IMPLANT
TOWEL OR NON WOVEN STRL DISP B (DISPOSABLE) ×4 IMPLANT
TRAY FOLEY MTR SLVR 16FR STAT (SET/KITS/TRAYS/PACK) ×2 IMPLANT
TRAY LAPAROSCOPIC (CUSTOM PROCEDURE TRAY) ×2 IMPLANT
TROCAR XCEL 12X100 BLDLESS (ENDOMECHANICALS) ×2 IMPLANT
WATER STERILE IRR 1000ML POUR (IV SOLUTION) ×2 IMPLANT

## 2020-07-21 NOTE — Discharge Instructions (Signed)

## 2020-07-21 NOTE — Anesthesia Procedure Notes (Signed)
Procedure Name: Intubation Date/Time: 07/21/2020 11:55 AM Performed by: Gerald Leitz, CRNA Pre-anesthesia Checklist: Patient identified, Patient being monitored, Timeout performed, Emergency Drugs available and Suction available Patient Re-evaluated:Patient Re-evaluated prior to induction Oxygen Delivery Method: Circle system utilized Preoxygenation: Pre-oxygenation with 100% oxygen Induction Type: IV induction Ventilation: Mask ventilation without difficulty Laryngoscope Size: Mac and 3 Grade View: Grade I Tube type: Oral Tube size: 7.5 mm Number of attempts: 1 Placement Confirmation: ETT inserted through vocal cords under direct vision, positive ETCO2 and breath sounds checked- equal and bilateral Secured at: 22 cm Tube secured with: Tape Dental Injury: Teeth and Oropharynx as per pre-operative assessment

## 2020-07-21 NOTE — Op Note (Signed)
Operative Note  Preoperative diagnosis:  1.  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  Meatal stenosis  Procedure(s): 1.  Robot-assisted laparoscopic ligation of left lower renal pole crossing vein 2.  Cystoscopy with diagnostic left ureteroscopy and ureteral stent placement 3.  Left retrograde pyelogram with intraoperative interpretation of fluoroscopic imaging 4.  Urethral dilation  Surgeon: Ellison Hughs, MD  Assistants: Debbrah Alar, PA-C An assistant was required for this surgical procedure.  The duties of the assistant included but were not limited to suctioning, passing suture, camera manipulation, retraction.  This procedure would not be able to be performed without an Environmental consultant.   Anesthesia:  General  Complications: None  EBL: 20 mL  Specimens: 1.  None  Drains/Catheters: 1.  6 French, 24 cm left JJ stent without tether 2.  16 French Foley catheter with 10 mL of sterile water in the balloon  Intraoperative findings:   Prominent left lower pole crossing vein involving the anterior surface of the left UPJ and wrapping around to the posterior aspects of the left renal pelvis. Left retrograde pyelogram revealed no filling defects along the entire length of the left ureter.  There was dilation of the left renal pelvis and its associated calyces, but the left UPJ exhibited a tapered appearance with no evidence of a high insertion and drained readily with peristalsis of the ureter.  Indication:  Patrick Bullock is a 30 y.o. male with seen in the emergency department in March 2022 due to severe left-sided flank pain, which she states has been occurring intermittently for the past 3 years.  A CT at that time that revealed severe left-sided hydronephrosis to UPJ obstruction secondary to a left lower pole crossing vessel.  He had a MAG3 renogram that revealed 50/50  differential function between both kidneys with a slight delay in drainage involving the left kidney at 11.2 minutes.  The right kidney drained and 8.2 minutes.  He has been consented for the above procedures, voices understanding and wishes to proceed.  Description of procedure:  After informed consent was obtained, the patient was brought to the operating room and general endotracheal anesthesia was administered.  The patient was then placed in the right lateral decubitus position and prepped and draped in usual sterile fashion.  A timeout was performed.  An 8 mm incision was then made lateral to the left rectus muscle at the level of the left 12th rib.  Abdominal access was obtained via a Veress needle.  The abdominal cavity was then insufflated up to 15 mmHg.  An 8 mm port was then introduced into the abdominal cavity.  Inspection of the port entry site by the robotic camera revealed no adjacent organ injury.  We then placed 3 additional 8 mm robotic ports to triangulate the left renal hilum.  A 12 mm assistant port was then placed between the carmera port and 3rd robotic arm.  The white line of Toldt along the descending colon was incised sharply and the colon, along with its mesocolonic fat, was reflected medially until the aorta was identified.  We then made a small window adjacent to the lower pole of the left kidney, identifying the left psoas muscle, left ureter and left gonadal vein.  The left ureter was traced up to the left renal pelvis where a prominent vein was seen crossing over the anterior surface of the left UPJ and wrapping around the  lateral aspects of the UPJ.  There was no identifiable artery associated with this vein, which appeared to be a branch off of the gonadal vein.  Due to the high likelihood is this aberrant vein being the source of his UPJ obstruction, it was ligated with Hem-o-lok clips.  I monitored the left ureter and renal pelvis for several minutes and there was excellent  peristalsis of the ureter, which had a tapered appearance into the left renal pelvis.  To assure that there were no intrinsic sources of his left UPJ obstruction, the patient was repositioned into the dorsolithotomy position and prepped and draped in the usual fashion.  I attempted to place a 23 French rigid cystoscope, but resistance was met at the urethral meatus.  Leander Rams sounds were used to dilate the urethral meatus, starting at 14 [place] and progressing up to 24 [place], and 2 [place] increments.  I was then able to easily insert the 23 French rigid cystoscope into the urethra and up into the bladder.  A complete bladder survey revealed no intravesical pathology.  A 5 French ureteral catheter was then inserted into the left ureteral orifice and a retrograde pyelogram was obtained, with the findings listed above.   The left UPJ had a tapered appearance in the dependent portion of the left renal pelvis and appeared to be readily draining contrast with peristalsis.  There were no intrinsic filling defects seen within the lumen of the left ureter or left UPJ.  A sensor wire was then advanced through the lumen of the ureteral catheter up to the left renal pelvis, or fluoroscopic guidance.   I attempted to pass a flexible ureteroscope over the wire and up to the level of the left UPJ, but resistance was met at the left iliac vessels.  I attempted to dilate the left ureter with a 12 French dual-lumen catheter over a sensor wire, but could not progress the catheter over the level of the left iliac vasculature.  I directly visualize the area within the left mid ureter that was done allowing the ureteroscope to progress and there was no evidence of ureteral stricture disease.  Photographs were taken of this area.  At that point, the decision was made to abandon ureteroscopy.  A 6 French, 24 cm JJ stent was then placed over the wire and into position within the left collecting system, confirming placement via  fluoroscopy.  The rigid cystoscope was removed and a 16 [place] Foley catheter was then inserted into the bladder with return of light pink urine.  The balloon of the catheter was inflated with 10 mL of sterile water and the catheter was placed to gravity drainage.  The patient's abdomen was then reprepped and draped in the usual sterile fashion.  The fascia within the assistant port incision was then reapproximated using an interrupted 0 Vicryl suture.  The port incisions were then closed with 4-0 Monocryl and dressed with Dermabond.  The patient was awoken from anesthesia having tolerated the procedure well.  He was transferred to the postanesthesia in stable condition.  Plan: Monitor on the floor overnight

## 2020-07-21 NOTE — Transfer of Care (Signed)
Immediate Anesthesia Transfer of Care Note  Patient: Patrick Bullock  Procedure(s) Performed: Procedure(s): XI ROBOTIC ASSISTED LIGATION OF CROSSING UPJ VESSEL (Left) CYSTOSCOPY WITH RETROGRADE PYELOGRAM, URETEROSCOPY, STENT PLACEMENT (Left)  Patient Location: PACU  Anesthesia Type:General  Level of Consciousness: Alert, Awake, Oriented  Airway & Oxygen Therapy: Patient Spontanous Breathing  Post-op Assessment: Report given to RN  Post vital signs: Reviewed and stable  Last Vitals:  Vitals:   07/21/20 1055  BP: 134/77  Pulse: 76  Resp: 17  Temp: 36.9 C  SpO2: 98%    Complications: No apparent anesthesia complications

## 2020-07-21 NOTE — Addendum Note (Signed)
Addendum  created 07/21/20 1814 by Basilio Cairo, CRNA   Intraprocedure Meds edited

## 2020-07-21 NOTE — H&P (Signed)
Office Visit Report     07/06/2020   --------------------------------------------------------------------------------   Patrick Bullock  MRN: [id number]  DOB: [birth year], 30 year old Male  PROVIDER:  Rhoderick Moody, M.D.  TREATING:  Ulyses Amor, [place]  LOCATION:  [facility] [phone]     --------------------------------------------------------------------------------   CC/HPI: Pt presents today for pre-operative history and physical exam in anticipation of robotic assisted lap left pyeloplasty by Dr. Liliane Shi on 07/21/20. He is doing well and is without complaint.   Pt denies F/C, HA, CP, SOB, N/V, diarrhea/constipation, back pain, flank pain, hematuria, and dysuria.    HX:   Left UPJ obstruction   Patrick Bullock is a 30 year old male who was seen in the emergency department on 04/12/2020 due to acute left-sided flank pain associated with nausea and vomiting. He had a CT abd/pel with contrast that revealed a left-sided UPJ obstruction likely secondary to a lower pole crossing vessel. He states that he has been experiencing intermittent episodes of sharp left-sided flank pain associated with nausea/vomiting for the past 3 years. He states that he typically has 2-3 episodes of flank pain per month that are manageable with NSAIDs, but will also go several months at a time without having any pain at all. Currently, his flank pain has resolved. No prior GU surgery/trauma or infections. He is urinating without difficulty and denies dysuria or hematuria. No prior history of kidney stones.     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 - 04/16/2020    NON-GU PMH: None   FAMILY HISTORY: None   SOCIAL HISTORY: Marital Status: Single Preferred Language: English; Ethnicity: Not Hispanic Or Latino; Race: White Current Smoking Status: Patient has never  smoked.   Tobacco Use Assessment Completed: Used Tobacco in last 30 days? Does not use smokeless tobacco. Does not drink anymore.  Does not use drugs. Drinks 2 caffeinated drinks per day. Has not had a blood transfusion.     Notes: Cigar or two per year    REVIEW OF SYSTEMS:    GU Review Male:   Patient denies frequent urination, hard to postpone urination, burning/ pain with urination, get up at night to urinate, leakage of urine, stream starts and stops, trouble starting your stream, have to strain to urinate , erection problems, and penile pain.  Gastrointestinal (Upper):   Patient denies nausea, vomiting, and indigestion/ heartburn.  Gastrointestinal (Lower):   Patient denies diarrhea and constipation.  Constitutional:   Patient denies fever, night sweats, weight loss, and fatigue.  Skin:   Patient denies skin rash/ lesion and itching.  Eyes:   Patient denies blurred vision and double vision.  Ears/ Nose/ Throat:   Patient denies sore throat and sinus problems.  Hematologic/Lymphatic:   Patient denies swollen glands and easy bruising.  Cardiovascular:   Patient denies leg swelling and chest pains.  Respiratory:   Patient denies cough and shortness of breath.  Endocrine:   Patient denies excessive thirst.  Musculoskeletal:   Patient denies joint pain and back pain.  Neurological:   Patient denies headaches and dizziness.  Psychologic:   Patient denies depression and anxiety.   Notes: Pt reports morphine is ineffective with pain relief.     VITAL SIGNS:      07/06/2020 01:52 PM  Weight 174 lb / 78.93 kg  Height 69 in / 175.26 cm  BP 124/82 mmHg  Pulse 75 /min  Temperature 97.5 F / 36.3 C  BMI 25.7 kg/m   MULTI-SYSTEM PHYSICAL EXAMINATION:    Constitutional: Well-nourished. No physical deformities. Normally developed. Good grooming.  Neck: Neck symmetrical, not swollen. Normal tracheal position.  Respiratory: Normal breath sounds. No labored breathing, no use of accessory  muscles.   Cardiovascular: Regular rate and rhythm. No murmur, no gallop.   Lymphatic: No enlargement of neck, axillae, groin.  Skin: No paleness, no jaundice, no cyanosis. No lesion, no ulcer, no rash.  Neurologic / Psychiatric: Oriented to time, oriented to place, oriented to person. No depression, no anxiety, no agitation.  Gastrointestinal: No mass, no tenderness, no rigidity, non obese abdomen.  Eyes: Normal conjunctivae. Normal eyelids.  Ears, Nose, Mouth, and Throat: Left ear no scars, no lesions, no masses. Right ear no scars, no lesions, no masses. Nose no scars, no lesions, no masses. Normal hearing. Normal lips.  Musculoskeletal: Normal gait and station of head and neck.     Complexity of Data: CLINICAL DATA: Left lower quadrant abdominal pain   EXAM:  CT ABDOMEN AND PELVIS WITH CONTRAST   TECHNIQUE:  Multidetector CT imaging of the abdomen and pelvis was performed  using the standard protocol following bolus administration of  intravenous contrast.   CONTRAST: OMNIPAQUE IOHEXOL 300 MG/ML SOLN   COMPARISON: None.   FINDINGS:  Lower chest: No acute abnormality.   Hepatobiliary: No focal liver abnormality. No gallstones,  gallbladder wall thickening, or pericholecystic fluid. No biliary  dilatation.   Pancreas: No focal lesion. Normal pancreatic contour. No surrounding  inflammatory changes. No main pancreatic ductal dilatation.   Spleen: Normal in size without focal abnormality.   Adrenals/Urinary Tract: No adrenal nodule bilaterally. Trace delayed  left nephrogram. No striated nephrogram identified. Moderate to  severe left hydronephrosis with marked dilatation of the renal  pelvis and suggestion of luminal hyperdensity that could represent  excreted intravenous contrast. Brisk change in caliber at the  ureteropelvic junction. No calcified stone identified. No  hydroureter. The urinary bladder is unremarkable.   Stomach/Bowel: PO contrast reaches the mid  small bowel. Stomach is  within normal limits. No evidence of bowel wall thickening or  dilatation. Appendix appears normal.   Vascular/Lymphatic: No abdominal aorta or iliac aneurysm. Mild  atherosclerotic plaque of the aorta and its branches. No abdominal,  pelvic, or inguinal lymphadenopathy.   Reproductive: Prostate is unremarkable.   Other: No intraperitoneal free fluid. No intraperitoneal free gas.  No organized fluid collection.   Musculoskeletal: No acute or significant osseous findings.   IMPRESSION:  Left ureteropelvic junction obstruction. Moderate to severe left  hydronephrosis associated with trace delayed nephrogram and marked  dilatation of the renal pelvis. There is brisk caliber change at the  ureteropelvic junction suggestive of a possible stricture.  Intraluminal mass not excluded on this single-phase venous contrast  study. No calcified renal or ureteral stone identified.    Electronically Signed  By: Tish Frederickson M.D.  On: 04/12/2020 22:30 Records Review:   Previous Patient Records  Urine Test Review:   Urinalysis   07/06/20  Urinalysis  Urine Appearance Clear   Urine Color Yellow   Urine Glucose Neg mg/dL  Urine Bilirubin Neg mg/dL  Urine Ketones Neg mg/dL  Urine Specific Gravity 1.025   Urine Blood Neg ery/uL  Urine pH 7.0   Urine Protein Neg mg/dL  Urine Urobilinogen 0.2 mg/dL  Urine Nitrites Neg   Urine Leukocyte Esterase Neg leu/uL   PROCEDURES:  Urinalysis - 81003 Dipstick Dipstick Cont'd  Color: Yellow Bilirubin: Neg mg/dL  Appearance: Clear Ketones: Neg mg/dL  Specific Gravity: 1.224 Blood: Neg ery/uL  pH: 7.0 Protein: Neg mg/dL  Glucose: Neg mg/dL Urobilinogen: 0.2 mg/dL    Nitrites: Neg    Leukocyte Esterase: Neg leu/uL    ASSESSMENT: 30 year old male with a left UPJ obstruction secondary to a lower pole crossing vessel.     PLAN: The risks, benefits and alternatives of LEFT robot-assisted pyeloplasty was discussed  with the patient. Risks include, but are not limited to, bleeding, infection, recurrence of the UPJ obstruction, ureteral stricture disease, urine leak, chronic pain, nephrectomy, multiple surgeries to correct her UPJ obstruction and the need for an indwelling stent, MI, CVA, PE, DVT and in the inherent risks of general anesthesia. He voices understanding and wishes to proceed.

## 2020-07-21 NOTE — Anesthesia Postprocedure Evaluation (Signed)
Anesthesia Post Note  Patient: Patrick Bullock  Procedure(s) Performed: XI ROBOTIC ASSISTED LIGATION OF CROSSING UPJ VESSEL (Left) CYSTOSCOPY WITH RETROGRADE PYELOGRAM, URETEROSCOPY, STENT PLACEMENT (Left)     Patient location during evaluation: PACU Anesthesia Type: General Level of consciousness: awake and alert Pain management: pain level controlled Vital Signs Assessment: post-procedure vital signs reviewed and stable Respiratory status: spontaneous breathing, nonlabored ventilation and respiratory function stable Cardiovascular status: blood pressure returned to baseline and stable Postop Assessment: no apparent nausea or vomiting Anesthetic complications: no   No notable events documented.  Last Vitals:  Vitals:   07/21/20 1649 07/21/20 1700  BP:    Pulse:    Resp:    Temp: (!) 36.4 C (P) 36.4 C  SpO2:      Last Pain:  Vitals:   07/21/20 1055  TempSrc: Oral  PainSc: 0-No pain                 Lowella Curb

## 2020-07-21 NOTE — Anesthesia Preprocedure Evaluation (Signed)
Anesthesia Evaluation  Patient identified by MRN, date of birth, ID band Patient awake    Reviewed: Allergy & Precautions, NPO status , Patient's Chart, lab work & pertinent test results  Airway Mallampati: II  TM Distance: >3 FB Neck ROM: Full    Dental no notable dental hx.    Pulmonary neg pulmonary ROS,    Pulmonary exam normal breath sounds clear to auscultation       Cardiovascular negative cardio ROS Normal cardiovascular exam Rhythm:Regular Rate:Normal     Neuro/Psych negative neurological ROS  negative psych ROS   GI/Hepatic negative GI ROS, Neg liver ROS,   Endo/Other  negative endocrine ROS  Renal/GU negative Renal ROS  negative genitourinary   Musculoskeletal negative musculoskeletal ROS (+)   Abdominal   Peds negative pediatric ROS (+)  Hematology negative hematology ROS (+)   Anesthesia Other Findings   Reproductive/Obstetrics negative OB ROS                             Anesthesia Physical Anesthesia Plan  ASA: 2  Anesthesia Plan: General   Post-op Pain Management:    Induction: Intravenous  PONV Risk Score and Plan: 2 and Ondansetron, Midazolam and Treatment may vary due to age or medical condition  Airway Management Planned: Oral ETT  Additional Equipment:   Intra-op Plan:   Post-operative Plan: Extubation in OR  Informed Consent: I have reviewed the patients History and Physical, chart, labs and discussed the procedure including the risks, benefits and alternatives for the proposed anesthesia with the patient or authorized representative who has indicated his/her understanding and acceptance.     Dental advisory given  Plan Discussed with: CRNA  Anesthesia Plan Comments:         Anesthesia Quick Evaluation

## 2020-07-22 ENCOUNTER — Encounter (HOSPITAL_COMMUNITY): Payer: Self-pay | Admitting: Urology

## 2020-07-22 ENCOUNTER — Other Ambulatory Visit (HOSPITAL_COMMUNITY): Payer: Self-pay

## 2020-07-22 ENCOUNTER — Other Ambulatory Visit: Payer: Self-pay

## 2020-07-22 DIAGNOSIS — N135 Crossing vessel and stricture of ureter without hydronephrosis: Secondary | ICD-10-CM | POA: Diagnosis not present

## 2020-07-22 LAB — BASIC METABOLIC PANEL
Anion gap: 7 (ref 5–15)
BUN: 12 mg/dL (ref 6–20)
CO2: 26 mmol/L (ref 22–32)
Calcium: 9.3 mg/dL (ref 8.9–10.3)
Chloride: 105 mmol/L (ref 98–111)
Creatinine, Ser: 1.09 mg/dL (ref 0.61–1.24)
GFR, Estimated: >60 mL/min (ref >60)
Glucose, Bld: 120 mg/dL — ABNORMAL HIGH (ref 70–99)
Potassium: 4.1 mmol/L (ref 3.5–5.1)
Sodium: 138 mmol/L (ref 135–145)

## 2020-07-22 LAB — HEMOGLOBIN AND HEMATOCRIT, BLOOD
HCT: 41.6 % (ref 39.0–52.0)
Hemoglobin: 13.5 g/dL (ref 13.0–17.0)

## 2020-07-22 MED ORDER — CHLORHEXIDINE GLUCONATE CLOTH 2 % EX PADS
6.0000 | MEDICATED_PAD | Freq: Every day | CUTANEOUS | Status: DC
  Administered 2020-07-22: 6 via TOPICAL

## 2020-07-22 NOTE — Discharge Summary (Signed)
Date of admission: 07/21/2020  Date of discharge: 07/22/2020  Admission diagnosis: UPJ obstruction  Discharge diagnosis: same  Secondary diagnoses: none  History and Physical: For full details, please see admission history and physical. Briefly, Patrick Bullock is a 30 y.o. year old patient who was seen in the emergency department on 04/12/2020 due to acute left-sided flank pain associated with nausea and vomiting. He had a CT abd/pel with contrast that revealed a left-sided UPJ obstruction likely secondary to a lower pole crossing vessel. He states that he has been experiencing intermittent episodes of sharp left-sided flank pain associated with nausea/vomiting for the past 3 years. He states that he typically has 2-3 episodes of flank pain per month that are manageable with NSAIDs, but will also go several months at a time without having any pain at all. Currently, his flank pain has resolved. No prior GU surgery/trauma or infections. He is urinating without difficulty and denies dysuria or hematuria. No prior history of kidney stones.    Hospital Course: Pt was admitted and taken to the OR on 07/21/20 for a robotic assisted lap ligation of left lower renal pole crossing vein, cystoscopy with diagnostic left ureteroscopy and ureteral stent placement, left retrograde pyelogram with intraoperative interpretation of fluoroscopic imaging, and urethral dilation. He tolerated the procedure well and remained hemodynamically stable throughout.  He was extubated without complication and woke up from anesthesia neurologically intact.  Pt was transferred from OR to PACU and then to floor without issue.  His post op course progressed as expected.  On POD 1 he was ambulating with good pain control.  He was passing flatus and his diet was advanced which he tolerated well.  Labs and vitals were stable. Pt was felt stable for d/c home with foley in place on the afternoon of POD 1.   Laboratory values:  Recent Labs     07/21/20 1709 07/22/20 0517  HGB 13.8 13.5  HCT 41.4 41.6   Recent Labs    07/22/20 0517  CREATININE 1.09    Disposition: Home  Discharge instruction: The patient was instructed to be ambulatory but told to refrain from heavy lifting, strenuous activity, or driving.   Discharge medications:  Allergies as of 07/22/2020   No Known Allergies      Medication List     STOP taking these medications    ibuprofen 200 MG tablet Commonly known as: ADVIL   morphine 15 MG tablet Commonly known as: MSIR   ondansetron 4 MG disintegrating tablet Commonly known as: Zofran ODT       TAKE these medications    docusate sodium 100 MG capsule Commonly known as: COLACE Take 1 capsule (100 mg total) by mouth 2 (two) times daily.   HYDROcodone-acetaminophen 5-325 MG tablet Commonly known as: Norco Take 1-2 tablets by mouth every 6 (six) hours as needed for moderate pain.   promethazine 12.5 MG tablet Commonly known as: PHENERGAN Take 1 tablet (12.5 mg total) by mouth every 4 (four) hours as needed for nausea or vomiting.   sulfamethoxazole-trimethoprim 800-160 MG tablet Commonly known as: BACTRIM DS Take 1 tablet by mouth 2 (two) times daily. Start the day prior to foley removal appointment        Followup:   Follow-up Information     Rene Paci, MD Follow up on 07/26/2020.   Specialty: Urology Why: Follow-up at 9:00 AM for catheter and stent removal.  My office will call to confirm appointment. Contact information: [address]  [address] [place] [address] [phone]

## 2020-07-22 NOTE — Progress Notes (Signed)
Patient ambulated up and down the hall three times and it was well tolerated .

## 2020-07-22 NOTE — Progress Notes (Signed)
1 Day Post-Op Subjective: Patient reports good pain control.  Amb x 3 last night.  Denies N/V. Labs stable. Good UO. +flatus  Objective: Vital signs in last 24 hours: Temp:  [96.4 F (35.8 C)-98.5 F (36.9 C)] 98.2 F (36.8 C) (06/23 0512) Pulse Rate:  [53-93] 93 (06/23 0512) Resp:  [16-20] 16 (06/23 0512) BP: (99-134)/(41-85) 118/58 (06/23 0512) SpO2:  [98 %-100 %] 100 % (06/23 0512) Weight:  [78.5 kg] 78.5 kg (06/22 1055)  Intake/Output from previous day: 06/22 0701 - 06/23 0700 In: 3064.3 [I.V.:2914.3; IV Piggyback:150] Out: 2980 [Urine:2950; Blood:30] Intake/Output this shift: No intake/output data recorded.  Physical Exam:  General:alert, cooperative, and no distress Cardiovascular: RRR Lungs: CTA GI: soft, non tender, decreased bowel sounds, no palpable masses Incisions: C/D/I Urine: clear/yellow Extremities: no edema  Lab Results: Recent Labs    07/21/20 1709 07/22/20 0517  HGB 13.8 13.5  HCT 41.4 41.6   BMET Recent Labs    07/22/20 0517  NA 138  K 4.1  CL 105  CO2 26  GLUCOSE 120*  BUN 12  CREATININE 1.09  CALCIUM 9.3   No results for input(s): LABPT, INR in the last 72 hours. No results for input(s): LABURIN in the last 72 hours. Results for orders placed or performed during the hospital encounter of 07/19/20  SARS CORONAVIRUS 2 (TAT 6-24 HRS) Nasopharyngeal Nasopharyngeal Swab     Status: None   Collection Time: 07/19/20  9:58 AM   Specimen: Nasopharyngeal Swab  Result Value Ref Range Status   SARS Coronavirus 2 NEGATIVE NEGATIVE Final    Comment: (NOTE) SARS-CoV-2 target nucleic acids are NOT DETECTED.  The SARS-CoV-2 RNA is generally detectable in upper and lower respiratory specimens during the acute phase of infection. Negative results do not preclude SARS-CoV-2 infection, do not rule out co-infections with other pathogens, and should not be used as the sole basis for treatment or other patient management decisions. Negative results  must be combined with clinical observations, patient history, and epidemiological information. The expected result is Negative.  Fact Sheet for Patients: HairSlick.no  Fact Sheet for Healthcare Providers: quierodirigir.com  This test is not yet approved or cleared by the [place] FDA and  has been authorized for detection and/or diagnosis of SARS-CoV-2 by FDA under an Emergency Use Authorization (EUA). This EUA will remain  in effect (meaning this test can be used) for the duration of the COVID-19 declaration under Se ction 564(b)(1) of the Act, 21 U.S.C. section 360bbb-3(b)(1), unless the authorization is terminated or revoked sooner.  Performed at [facility], [address], [place], [place] [address]     Studies/Results: DG C-Arm 1-60 Min-No Report  Result Date: 07/21/2020 Fluoroscopy was utilized by the requesting physician.  No radiographic interpretation.    Assessment/Plan: 1 Day Post-Op, Procedure(s) (LRB): XI ROBOTIC ASSISTED LIGATION OF CROSSING UPJ VESSEL (Left) CYSTOSCOPY WITH RETROGRADE PYELOGRAM, URETEROSCOPY, STENT PLACEMENT (Left)  Ambulate, IS DVT prophy Advance diet Leave foley--pt had urethral dilation intra-op and will need foley in place for several days.  Pt will need foley education prior to d/c SLIV Transition to PO pain meds Plan for possible d/c later today    LOS: 0 days   Harrie Foreman 07/22/2020, 7:33 AM

## 2020-07-22 NOTE — Progress Notes (Signed)
RN explained and provided discharge instructions, along with items needed to care for foley catheter. RN explained how to empty and properly care for the catheter and incision sites. All questions answered. IV removed. Pt dressed in personal clothing. Pt chose to ambulate to front entrance with family.

## 2020-07-26 ENCOUNTER — Other Ambulatory Visit (HOSPITAL_COMMUNITY): Payer: Self-pay

## 2020-07-27 ENCOUNTER — Other Ambulatory Visit (HOSPITAL_COMMUNITY): Payer: Self-pay

## 2020-07-30 ENCOUNTER — Emergency Department (HOSPITAL_COMMUNITY)
Admission: EM | Admit: 2020-07-30 | Discharge: 2020-07-30 | Disposition: A | Discharge status: Home / Self Care | Payer: 59 | Attending: Emergency Medicine | Admitting: Emergency Medicine

## 2020-07-30 ENCOUNTER — Emergency Department (HOSPITAL_COMMUNITY): Payer: 59

## 2020-07-30 ENCOUNTER — Encounter (HOSPITAL_COMMUNITY): Payer: Self-pay | Admitting: Emergency Medicine

## 2020-07-30 DIAGNOSIS — M791 Myalgia, unspecified site: Secondary | ICD-10-CM | POA: Insufficient documentation

## 2020-07-30 DIAGNOSIS — R109 Unspecified abdominal pain: Secondary | ICD-10-CM | POA: Diagnosis not present

## 2020-07-30 DIAGNOSIS — G8918 Other acute postprocedural pain: Secondary | ICD-10-CM | POA: Diagnosis not present

## 2020-07-30 LAB — COMPREHENSIVE METABOLIC PANEL
ALT: 31 U/L (ref 0–44)
AST: 26 U/L (ref 15–41)
Albumin: 4.4 g/dL (ref 3.5–5.0)
Alkaline Phosphatase: 70 U/L (ref 38–126)
Anion gap: 7 (ref 5–15)
BUN: 14 mg/dL (ref 6–20)
CO2: 28 mmol/L (ref 22–32)
Calcium: 9.8 mg/dL (ref 8.9–10.3)
Chloride: 102 mmol/L (ref 98–111)
Creatinine, Ser: 1.07 mg/dL (ref 0.61–1.24)
GFR, Estimated: >60 mL/min (ref >60)
Glucose, Bld: 118 mg/dL — ABNORMAL HIGH (ref 70–99)
Potassium: 4.6 mmol/L (ref 3.5–5.1)
Sodium: 137 mmol/L (ref 135–145)
Total Bilirubin: 0.6 mg/dL (ref 0.3–1.2)
Total Protein: 7.8 g/dL (ref 6.5–8.1)

## 2020-07-30 LAB — URINALYSIS, ROUTINE W REFLEX MICROSCOPIC
Bilirubin Urine: NEGATIVE
Glucose, UA: NEGATIVE mg/dL
Ketones, ur: NEGATIVE mg/dL
Nitrite: NEGATIVE
Protein, ur: NEGATIVE mg/dL
Specific Gravity, Urine: 1.012 (ref 1.005–1.030)
pH: 5 (ref 5.0–8.0)

## 2020-07-30 LAB — CBC WITH DIFFERENTIAL/PLATELET
Abs Immature Granulocytes: 0.17 10*3/uL — ABNORMAL HIGH (ref 0.00–0.07)
Basophils Absolute: 0 10*3/uL (ref 0.0–0.1)
Basophils Relative: 0 %
Eosinophils Absolute: 0.1 10*3/uL (ref 0.0–0.5)
Eosinophils Relative: 0 %
HCT: 42 % (ref 39.0–52.0)
Hemoglobin: 13.7 g/dL (ref 13.0–17.0)
Immature Granulocytes: 1 %
Lymphocytes Relative: 10 %
Lymphs Abs: 1.5 10*3/uL (ref 0.7–4.0)
MCH: 30 pg (ref 26.0–34.0)
MCHC: 32.6 g/dL (ref 30.0–36.0)
MCV: 91.9 fL (ref 80.0–100.0)
Monocytes Absolute: 1.3 10*3/uL — ABNORMAL HIGH (ref 0.1–1.0)
Monocytes Relative: 8 %
Neutro Abs: 12 10*3/uL — ABNORMAL HIGH (ref 1.7–7.7)
Neutrophils Relative %: 81 %
Platelets: 312 10*3/uL (ref 150–400)
RBC: 4.57 MIL/uL (ref 4.22–5.81)
RDW: 11.9 % (ref 11.5–15.5)
WBC: 15 10*3/uL — ABNORMAL HIGH (ref 4.0–10.5)
nRBC: 0 % (ref 0.0–0.2)

## 2020-07-30 LAB — LACTIC ACID, PLASMA: Lactic Acid, Venous: 1 mmol/L (ref 0.5–1.9)

## 2020-07-30 LAB — LIPASE, BLOOD: Lipase: 24 U/L (ref 11–51)

## 2020-07-30 MED ORDER — SODIUM CHLORIDE (PF) 0.9 % IJ SOLN
INTRAMUSCULAR | Status: AC
  Filled 2020-07-30: qty 50

## 2020-07-30 MED ORDER — SULFAMETHOXAZOLE-TRIMETHOPRIM 800-160 MG PO TABS: 1.0000 | ORAL_TABLET | Freq: Two times a day (BID) | ORAL | 0 refills | Status: AC

## 2020-07-30 MED ORDER — IOHEXOL 300 MG/ML  SOLN
100.0000 mL | Freq: Once | INTRAMUSCULAR | Status: AC | PRN
  Administered 2020-07-30: 100 mL via INTRAVENOUS

## 2020-07-30 MED ORDER — SODIUM CHLORIDE 0.9 % IV SOLN
1.0000 g | Freq: Once | INTRAVENOUS | Status: AC
  Administered 2020-07-30: 1 g via INTRAVENOUS
  Filled 2020-07-30: qty 10

## 2020-07-30 MED ORDER — SODIUM CHLORIDE 0.9 % IV BOLUS
1000.0000 mL | Freq: Once | INTRAVENOUS | Status: AC
  Administered 2020-07-30: 1000 mL via INTRAVENOUS

## 2020-07-30 NOTE — ED Triage Notes (Signed)
Pt states that on 6/22 he had a pyeloplasty and had a 3 day course of antibiotics but now has fever, chills and fatigue. Alert and oriented. Catheter and stent already removed.

## 2020-07-30 NOTE — ED Notes (Signed)
Patient transported to CT 

## 2020-07-30 NOTE — ED Provider Notes (Signed)
Emergency Department Provider Note   I have reviewed the triage vital signs and the nursing notes.   HISTORY  Chief Complaint Post-op Problem   HPI Patrick Bullock is a 30 y.o. 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  Patient states that his postoperative course has been relatively easy.  He has developed rigors and body aches, however, over the last 24 hours.  He completed a 3-day course of antibiotics after surgery.  The catheter and stents have since been removed.  He denies any difficulty with urination, hesitancy, urgency, dysuria.  His flank pain is fairly minimal.  He has not recorded any fevers but contacted the urology office with his rigors and body aches and was advised to present here for evaluation.  He states his incisions are well-appearing with no drainage or surrounding redness.  He is having bowel movements.  He is not having vomiting.   History reviewed. No pertinent past medical history.  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  History reviewed. No pertinent family history.  Social History Social History   Tobacco Use   Smoking status: Never   Smokeless tobacco: Never  Vaping Use   Vaping Use: Never used   Substance Use Topics   Alcohol use: Not Currently   Drug use: Never    Review of Systems  Constitutional: No fever. Positive rigors and body aches.  Eyes: No visual changes. ENT: No sore throat. Cardiovascular: Denies chest pain. Respiratory: Denies shortness of breath. Gastrointestinal: No abdominal pain.  No nausea, no vomiting.  No diarrhea.  No constipation. Genitourinary: Negative for dysuria. Musculoskeletal: Negative for back pain. Skin: Negative for rash. Neurological: Negative for headaches, focal weakness or numbness.  10-point ROS otherwise negative.  ____________________________________________   PHYSICAL EXAM:  VITAL SIGNS: ED Triage Vitals  Enc Vitals Group     BP 07/30/20 1648 (!) 146/93     Pulse Rate 07/30/20 1648 (!) 104     Resp 07/30/20 1648 18     Temp 07/30/20 1648 98.8 F (37.1 C)     Temp Source 07/30/20 1648 Oral     SpO2 07/30/20 1648 95 %   Constitutional: Alert and oriented. Well appearing and in no acute distress. Eyes: Conjunctivae are normal. Head: Atraumatic. Nose: No congestion/rhinnorhea. Mouth/Throat: Mucous membranes are moist. Neck: No stridor.  Cardiovascular: Normal rate, regular rhythm. Good peripheral circulation. Grossly normal heart sounds.   Respiratory: Normal respiratory effort.  No retractions. Lungs CTAB. Gastrointestinal: Soft and nontender. No distention. Laparoscopic incisions are well appearing.  Musculoskeletal: No lower extremity tenderness nor edema. No gross deformities of extremities. Neurologic:  Normal speech and language. No gross focal neurologic deficits are appreciated.  Skin:  Skin is warm, dry and intact. No rash noted.   ____________________________________________   LABS (all labs ordered  are listed, but only abnormal results are displayed)  Labs Reviewed  COMPREHENSIVE METABOLIC PANEL - Abnormal; Notable for the following components:      Result Value   Glucose, Bld 118 (*)    All other  components within normal limits  CBC WITH DIFFERENTIAL/PLATELET - Abnormal; Notable for the following components:   WBC 15.0 (*)    Neutro Abs 12.0 (*)    Monocytes Absolute 1.3 (*)    Abs Immature Granulocytes 0.17 (*)    All other components within normal limits  URINALYSIS, ROUTINE W REFLEX MICROSCOPIC - Abnormal; Notable for the following components:   Hgb urine dipstick SMALL (*)    Leukocytes,Ua TRACE (*)    Bacteria, UA RARE (*)    All other components within normal limits  URINE CULTURE  CULTURE, BLOOD (ROUTINE X 2)  CULTURE, BLOOD (ROUTINE X 2)  LIPASE, BLOOD  LACTIC ACID, PLASMA  LACTIC ACID, PLASMA   ____________________________________________  RADIOLOGY  CT ABDOMEN PELVIS W CONTRAST  Result Date: 07/30/2020 CLINICAL DATA:  Left flank pain postop fever EXAM: CT ABDOMEN AND PELVIS WITH CONTRAST TECHNIQUE: Multidetector CT imaging of the abdomen and pelvis was performed using the standard protocol following bolus administration of intravenous contrast. CONTRAST:  OMNIPAQUE IOHEXOL 300 MG/ML  SOLN COMPARISON:  CT 04/12/2020 FINDINGS: Lower chest: Lung bases demonstrate no acute consolidation or effusion. Normal cardiac size. Hepatobiliary: No focal liver abnormality is seen. No gallstones, gallbladder wall thickening, or biliary dilatation. Pancreas: Unremarkable. No pancreatic ductal dilatation or surrounding inflammatory changes. Spleen: Normal in size without focal abnormality. Adrenals/Urinary Tract: Adrenal glands are normal. Decreased left hydronephrosis with resolution of calyceal dilatation. Residual dilatation of left extrarenal pelvis. Mild urothelial enhancement and Peri pelvic soft tissue stranding. Mild soft tissue stranding about the left ureter. The bladder is unremarkable Stomach/Bowel: Stomach is within normal limits. Appendix appears normal. No evidence of bowel wall thickening, distention, or inflammatory changes. Vascular/Lymphatic: No significant vascular  findings are present. No enlarged abdominal or pelvic lymph nodes. Reproductive: Prostate is unremarkable. Other: No free air.  Small free fluid in the pelvis Musculoskeletal: No acute or significant osseous findings. IMPRESSION: 1. Decreased left hydronephrosis compared to prior CT with mild residual dilatation of left extrarenal pelvis. There is mild left urothelial enhancement and soft tissue stranding about the left renal pelvis and ureter. Findings are nonspecific and could be secondary to resolving postoperative change though upper urinary tract infection could also produce this appearance. 2. Small amount of free fluid in the pelvis Electronically Signed   By: Jasmine Pang M.D.   On: 07/30/2020 21:53    ____________________________________________   PROCEDURES  Procedure(s) performed:   Procedures  None  ____________________________________________   INITIAL IMPRESSION / ASSESSMENT AND PLAN / ED COURSE  Pertinent labs & imaging results that were available during my care of the patient were reviewed by me and considered in my medical decision making (see chart for details).   Patient presents to the emergency department with rigors and body aches 8 days postop.  His incisions are well-appearing, clean, dry.  Patient has mild tachycardia on arrival but is afebrile.  No hypotension.  Will obtain a CT scan of the abdomen and pelvis with contrast to evaluate for postoperative infection or residual hydronephrosis.  No obvious urinary tract infection.  We will send blood and urine cultures.   CT imaging reviewed.  Findings show decreased hydronephrosis as was seen on prior CT scans.  There is some stranding in the area which  could be postoperative although UTI not excluded.  Patient did have rigors and some body aches.  On his UA he does have trace leukocytes, 11-20 WBCs, rare bacteria.  This is not a clear urine infection but given symptoms I will cover with Rocephin and additional Bactrim as  the patient was on this postoperatively.  He is feeling well, looking well, and I think can d/c after abx. Discussed Urology follow up plan and ED return precautions.  ____________________________________________  FINAL CLINICAL IMPRESSION(S) / ED DIAGNOSES  Final diagnoses:  Post-operative pain     MEDICATIONS GIVEN DURING THIS VISIT:  Medications  sodium chloride (PF) 0.9 % injection (has no administration in time range)  sodium chloride 0.9 % bolus 1,000 mL (0 mLs Intravenous Stopped 07/30/20 2230)  iohexol (OMNIPAQUE) 300 MG/ML solution 100 mL (100 mLs Intravenous Contrast Given 07/30/20 2133)  cefTRIAXone (ROCEPHIN) 1 g in sodium chloride 0.9 % 100 mL IVPB (0 g Intravenous Stopped 07/30/20 2310)    Note:  This document was prepared using Dragon voice recognition software and may include unintentional dictation errors.  Alona Bene, MD, [facility] Emergency Medicine    Bauer Ausborn, Arlyss Repress, MD 07/30/20 [phone]

## 2020-07-30 NOTE — ED Provider Notes (Signed)
Emergency Medicine Provider Triage Evaluation Note  Patrick Bullock , a 30 y.o. male  was evaluated in triage.  Pt complains of waking up this morning with chills, fatigue.  He had a pyeloplasty done on 07/21/2020 and was on a 3-day course of antibiotics.  He called his urologist today and was told to come to the ER for ruling out a kidney infection.  Review of Systems  Positive: Chills, fatigue Negative: Dysuria  Physical Exam  BP (!) 146/93 (BP Location: Left Arm)   Pulse (!) 104   Temp 98.8 F (37.1 C) (Oral)   Resp 18   SpO2 95%  Gen:   Awake, no distress   Resp:  Normal effort  MSK:   Moves extremities without difficulty  Other:  No signs respiratory distress  Medical Decision Making  Medically screening exam initiated at 5:07 PM.  Appropriate orders placed.  Hardeep Reetz was informed that the remainder of the evaluation will be completed by another provider, this initial triage assessment does not replace that evaluation, and the importance of remaining in the ED until their evaluation is complete.  Labwork and urine ordered.   Dietrich Pates, PA-C 07/30/20 1708    Milagros Loll, MD 07/31/20 1556

## 2020-07-30 NOTE — Discharge Instructions (Signed)
You were seen in the emergency room today with body aches and chills.  Your CT scan shows postoperative changes but no clear infection.  I am starting you on antibiotics for the next week in case there is some developing urine infection.  Please take your antibiotics as prescribed and follow-up with your urology team.  If your symptoms suddenly worsen you should return to the emergency department over the weekend for reevaluation.

## 2020-08-01 LAB — URINE CULTURE: Culture: NO GROWTH

## 2020-08-05 LAB — CULTURE, BLOOD (ROUTINE X 2)
Culture: NO GROWTH
Special Requests: ADEQUATE

## 2021-06-04 IMAGING — NM NM RENAL IMAGING FLOW W/ PHARM
2 series · 12 of 12 positions shown · non-contrast
Comparison: 04/12/2020

CLINICAL DATA: Left flank pain with nausea and vomiting, left UPJ
obstruction on recent CT

EXAM:
NUCLEAR MEDICINE RENAL SCAN WITH DIURETIC ADMINISTRATION
TECHNIQUE: Radionuclide angiographic and sequential renal images were obtained
after intravenous injection of radiopharmaceutical. Imaging was
continued during slow intravenous injection of Lasix approximately
15 minutes after the start of the examination.
RADIOPHARMACEUTICALS:  4.7 mCi Xechnetium-RRm MAG3 IV

[Series 1: renal scan · 4.14mm/px · 6 of 88 frames shown (1 of 2)]
[frame 8/88]
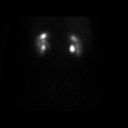
[frame 22/88]
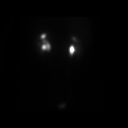
[frame 37/88]
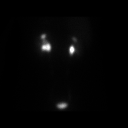
[frame 52/88]
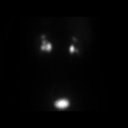
[frame 66/88]
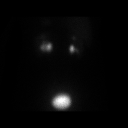
[frame 81/88]
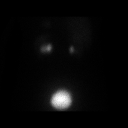

[Series 1: renal scan · 4.14mm/px · 6 of 40 frames shown (2 of 2)]
[frame 4/40  full-range]
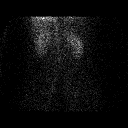
[frame 10/40  full-range]
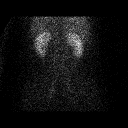
[frame 17/40  full-range]
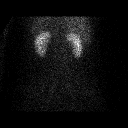
[frame 24/40  full-range]
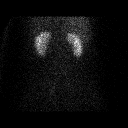
[frame 30/40  full-range]
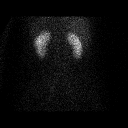
[frame 37/40  full-range]
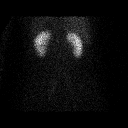

[12 of 12 positions shown; findings below may reference images not displayed]

FINDINGS: Flow:  Prompt symmetric arterial flow to the kidneys.

Left renogram: Normal left renographic curve, with T-max measuring
11.2 minutes.

Right renogram: Normal right renographic curve, with T-max measuring
8.2 minutes.

Differential:

Left kidney = 50 %

Right kidney = 50 %

T1/2 post Lasix :

Left kidney = 15.7 min

Right kidney = 12.7 min
IMPRESSION: 1. Symmetrical renal function.
2. No evidence of UPJ obstruction to correspond to the findings
within the left kidney on previous CT. Normal physiologic response
to Lasix.
